# Patient Record
Sex: Male | Born: 1980 | Race: Black or African American | Hispanic: No | Marital: Single | Smoking: Current every day smoker
Health system: Southern US, Community
[De-identification: ages and names within clinical notes are randomized; demographics above are authoritative.]

---

## 2011-11-10 ENCOUNTER — Emergency Department (HOSPITAL_BASED_OUTPATIENT_CLINIC_OR_DEPARTMENT_OTHER)
Admission: EM | Admit: 2011-11-10 | Discharge: 2011-11-10 | Disposition: A | Discharge status: Home / Self Care | Payer: BC Managed Care – PPO | Attending: Emergency Medicine | Admitting: Emergency Medicine

## 2011-11-10 DIAGNOSIS — J069 Acute upper respiratory infection, unspecified: Secondary | ICD-10-CM | POA: Insufficient documentation

## 2011-11-10 DIAGNOSIS — R059 Cough, unspecified: Secondary | ICD-10-CM | POA: Insufficient documentation

## 2011-11-10 DIAGNOSIS — R05 Cough: Secondary | ICD-10-CM | POA: Insufficient documentation

## 2011-11-10 DIAGNOSIS — R509 Fever, unspecified: Secondary | ICD-10-CM | POA: Insufficient documentation

## 2011-11-10 NOTE — ED Notes (Signed)
Pt reports that Wednesday "I had chills and a cough".  Pt reports he left work early and his boss is requiring him to get verification that he does not have the flu before he will let him return to work.

## 2011-11-10 NOTE — ED Provider Notes (Signed)
History     CSN: [id number] Arrival date & time: No admission date for patient encounter.   First MD Initiated Contact with Patient 11/10/11 [phone]      Chief Complaint  Patient presents with  . Fever  . Cough    (Consider location/radiation/quality/duration/timing/severity/associated sxs/prior treatment) Patient is a 30 y.o. male presenting with fever. The history is provided by the patient.  Fever Primary symptoms of the febrile illness include fever and cough. Primary symptoms do not include wheezing, shortness of breath, nausea, vomiting or diarrhea. The current episode started 3 to 5 days ago. This is a new problem. The problem has been resolved.  The fever began 2 days ago. The fever has been resolved since its onset. The maximum temperature recorded prior to his arrival was unknown.  The cough began 2 days ago. The cough is non-productive.    History reviewed. No pertinent past medical history.  History reviewed. No pertinent past surgical history.  No family history on file.  History  Substance Use Topics  . Smoking status: Current Everyday Smoker  . Smokeless tobacco: Not on file  . Alcohol Use: Yes     Occasionally      Review of Systems  Constitutional: Positive for fever and chills.  HENT: Positive for congestion and rhinorrhea. Negative for sore throat.   Respiratory: Positive for cough. Negative for shortness of breath and wheezing.   Gastrointestinal: Negative for nausea, vomiting and diarrhea.  All other systems reviewed and are negative.    Allergies  Review of patient's allergies indicates no known allergies.  Home Medications  No current outpatient prescriptions on file.  BP 129/82  Pulse 85  Temp(Src) 98.6 F (37 C) (Oral)  Resp 20  SpO2 100%  Physical Exam  Nursing note and vitals reviewed. Constitutional: He is oriented to person, place, and time. He appears well-developed and well-nourished. No distress.  HENT:  Head: Normocephalic  and atraumatic.  Right Ear: Tympanic membrane and ear canal normal.  Left Ear: Tympanic membrane and ear canal normal.  Mouth/Throat: Oropharynx is clear and moist.  Eyes: Conjunctivae and EOM are normal. Pupils are equal, round, and reactive to light.  Neck: Normal range of motion. Neck supple.  Cardiovascular: Normal rate, regular rhythm and intact distal pulses.   No murmur heard. Pulmonary/Chest: Effort normal and breath sounds normal. No respiratory distress. He has no wheezes. He has no rales.  Neurological: He is alert and oriented to person, place, and time.  Skin: Skin is warm and dry. No rash noted. No erythema.  Psychiatric: He has a normal mood and affect. His behavior is normal.    ED Course  Procedures (including critical care time)  Labs Reviewed - No data to display No results found.   No diagnosis found.    MDM   Pt with symptoms consistent with viral URI.  Well appearing here.  No signs of breathing difficulty  No signs of pharyngitis, otitis or abnormal abdominal findings.    Patient states his symptoms were worse on Wednesday and he started feeling better yesterday. He went to work this morning and his boss told him he had to go be cleared to return to work to ensure he did not have the flu. On exam patient has normal vital signs and no complaints. He is afebrile and he has no abnormal physical exam findings. Patient is cleared to return to work no further workup needed at this time.     Gwyneth Sprout, MD 11/10/11 [phone]

## 2012-06-16 ENCOUNTER — Encounter (HOSPITAL_BASED_OUTPATIENT_CLINIC_OR_DEPARTMENT_OTHER): Payer: Self-pay | Admitting: *Deleted

## 2012-06-16 ENCOUNTER — Emergency Department (HOSPITAL_BASED_OUTPATIENT_CLINIC_OR_DEPARTMENT_OTHER)
Admission: EM | Admit: 2012-06-16 | Discharge: 2012-06-16 | Disposition: A | Discharge status: Home / Self Care | Payer: BC Managed Care – PPO | Attending: Emergency Medicine | Admitting: Emergency Medicine

## 2012-06-16 DIAGNOSIS — S239XXA Sprain of unspecified parts of thorax, initial encounter: Secondary | ICD-10-CM | POA: Insufficient documentation

## 2012-06-16 DIAGNOSIS — F17200 Nicotine dependence, unspecified, uncomplicated: Secondary | ICD-10-CM | POA: Insufficient documentation

## 2012-06-16 DIAGNOSIS — R079 Chest pain, unspecified: Secondary | ICD-10-CM | POA: Insufficient documentation

## 2012-06-16 DIAGNOSIS — T148XXA Other injury of unspecified body region, initial encounter: Secondary | ICD-10-CM

## 2012-06-16 DIAGNOSIS — S5010XA Contusion of unspecified forearm, initial encounter: Secondary | ICD-10-CM | POA: Insufficient documentation

## 2012-06-16 MED ORDER — HYDROCODONE-ACETAMINOPHEN 5-500 MG PO TABS
1.0000 | ORAL_TABLET | Freq: Four times a day (QID) | ORAL | Status: AC | PRN
Start: 2012-06-16 — End: 2012-06-26

## 2012-06-16 MED ORDER — CYCLOBENZAPRINE HCL 10 MG PO TABS: 10.0000 mg | ORAL_TABLET | Freq: Two times a day (BID) | ORAL | Status: AC | PRN
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
## 2012-06-16 NOTE — Discharge Instructions (Signed)
Contusion  A contusion is a deep bruise. Contusions happen when an injury causes bleeding under the skin. Signs of bruising include pain, puffiness (swelling), and discolored skin. The contusion may turn blue, purple, or yellow.  HOME CARE    Put ice on the injured area.   Put ice in a plastic bag.   Place a towel between your skin and the bag.   Leave the ice on for 15 to 20 minutes, 3 to 4 times a day.   Only take medicine as told by your doctor.   Rest the injured area.   If possible, raise (elevate) the injured area to lessen puffiness.  GET HELP RIGHT AWAY IF:    You have more bruising or puffiness.   You have pain that is getting worse.   Your puffiness or pain is not helped by medicine.  MAKE SURE YOU:    Understand these instructions.   Will watch your condition.   Will get help right away if you are not doing well or get worse.  Document Released: 05/01/2008 Document Revised: 11/02/2011 Document Reviewed: 09/18/2011  [employer] Patient Information 2012 [employer].
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
## 2012-12-30 ENCOUNTER — Encounter (HOSPITAL_BASED_OUTPATIENT_CLINIC_OR_DEPARTMENT_OTHER): Payer: Self-pay | Admitting: *Deleted

## 2012-12-30 ENCOUNTER — Emergency Department (HOSPITAL_BASED_OUTPATIENT_CLINIC_OR_DEPARTMENT_OTHER)
Admission: EM | Admit: 2012-12-30 | Discharge: 2012-12-30 | Disposition: A | Discharge status: Home / Self Care | Payer: BC Managed Care – PPO | Attending: Emergency Medicine | Admitting: Emergency Medicine

## 2012-12-30 DIAGNOSIS — K529 Noninfective gastroenteritis and colitis, unspecified: Secondary | ICD-10-CM

## 2012-12-30 DIAGNOSIS — F17200 Nicotine dependence, unspecified, uncomplicated: Secondary | ICD-10-CM | POA: Insufficient documentation

## 2012-12-30 DIAGNOSIS — K5289 Other specified noninfective gastroenteritis and colitis: Secondary | ICD-10-CM | POA: Insufficient documentation

## 2012-12-30 DIAGNOSIS — R197 Diarrhea, unspecified: Secondary | ICD-10-CM | POA: Insufficient documentation

## 2012-12-30 LAB — URINALYSIS, ROUTINE W REFLEX MICROSCOPIC
Bilirubin Urine: NEGATIVE
Hgb urine dipstick: NEGATIVE
Ketones, ur: NEGATIVE mg/dL
Nitrite: NEGATIVE
Protein, ur: NEGATIVE mg/dL
Urobilinogen, UA: 1 mg/dL (ref 0.0–1.0)

## 2012-12-30 LAB — URINE MICROSCOPIC-ADD ON

## 2012-12-30 NOTE — ED Notes (Signed)
Denies fever.  Son recently had cold sx., no other known exposure to anyone with illness.

## 2012-12-30 NOTE — ED Provider Notes (Signed)
History  This chart was scribed for Loren Racer, MD by Shari Heritage, ED Scribe. The patient was seen in room MH05/MH05. Patient's care was started at 1750.   CSN: [id number]  Arrival date & time 12/30/12  1625   First MD Initiated Contact with Patient 12/30/12 1750      Chief Complaint  Patient presents with  . Abdominal Pain    Patient is a 32 y.o. male presenting with abdominal pain. The history is provided by the patient. No language interpreter was used.  Abdominal Pain The primary symptoms of the illness include abdominal pain and diarrhea. The primary symptoms of the illness do not include fever, nausea or vomiting. The current episode started yesterday. The onset of the illness was sudden. The problem has been rapidly improving.  The abdominal pain began yesterday. The pain came on suddenly. The abdominal pain has been rapidly improving since its onset. The abdominal pain is located in the epigastric region. The abdominal pain does not radiate. The abdominal pain is relieved by bowel movement.  The diarrhea began yesterday. The diarrhea is watery. The diarrhea occurs 5 to 10 times per day.  Symptoms associated with the illness do not include chills.    HPI Comments: Dennis Greene is a 32 y.o. male who presents to the Emergency Department complaining of gradually improving, epigastric abdominal cramping and improving diarrhea onset 33 hours ago. Abdominal pain is mild in severity. The timing is constant and it does not radiate. Abdominal pain is relieved by bowel movements. Patient states that he had 5 episodes of watery diarrhea since symptom onset. His last BM was this morning. Patient denies blood in stool, fever, chills, nausea or vomiting. He says the last meal he had prior to diarrhea onset was fried chicken and [employer]. He says that others ate the same meal, but had no symptoms. Patient denies any other significant past medical or surgical history.    No family history on  file.  History  Substance Use Topics  . Smoking status: Current Every Day Smoker -- 0.2 packs/day for 10 years    Types: Cigarettes  . Smokeless tobacco: Not on file  . Alcohol Use: Yes     Comment: Occasionally     Review of Systems  Constitutional: Negative for fever and chills.  Gastrointestinal: Positive for abdominal pain and diarrhea. Negative for nausea, vomiting and blood in stool.  All other systems reviewed and are negative.    Allergies  Review of patient's allergies indicates no known allergies.  Home Medications  No current outpatient prescriptions on file.  BP 148/94  Pulse 92  Temp 98 F (36.7 C) (Oral)  Resp 20  SpO2 100%  Physical Exam  Constitutional: He is oriented to person, place, and time. He appears well-developed and well-nourished.       Appears well hydrated.  HENT:  Head: Normocephalic and atraumatic.  Mouth/Throat: Oropharynx is clear and moist.  Eyes: Conjunctivae normal and EOM are normal. Pupils are equal, round, and reactive to light.  Cardiovascular: Normal rate, regular rhythm and normal heart sounds.   Pulmonary/Chest: Effort normal and breath sounds normal.  Abdominal: Soft. Bowel sounds are normal. He exhibits no distension. There is no tenderness. There is no rebound and no guarding.  Musculoskeletal: Normal range of motion. He exhibits no edema and no tenderness.       Moves all extremities without difficulty.  Neurological: He is alert and oriented to person, place, and time.  Skin: Skin is warm and  dry. No rash noted.  Psychiatric: He has a normal mood and affect. His behavior is normal.    ED Course  Procedures (including critical care time) DIAGNOSTIC STUDIES: Oxygen Saturation is 100% on room air, normal by my interpretation.    COORDINATION OF CARE: 6:10 PM- Patient informed of current plan for treatment and evaluation and agrees with plan at this time.    Labs Reviewed  URINALYSIS, ROUTINE W REFLEX MICROSCOPIC -  Abnormal; Notable for the following:    APPearance TURBID (*)     All other components within normal limits  URINE MICROSCOPIC-ADD ON   No results found.   1. Gastroenteritis       MDM  I personally performed the services described in this documentation, which was scribed in my presence. The recorded information has been reviewed and is accurate.    Loren Racer, MD 12/30/12 [phone]

## 2012-12-30 NOTE — ED Notes (Signed)
Abdominal pain diarrhea and vomiting. No diarrhea and vomiting today. He feels better today but still has a little abdominal discomfort.

## 2015-01-18 ENCOUNTER — Encounter (HOSPITAL_BASED_OUTPATIENT_CLINIC_OR_DEPARTMENT_OTHER): Payer: Self-pay | Admitting: Emergency Medicine

## 2015-01-18 ENCOUNTER — Emergency Department (HOSPITAL_BASED_OUTPATIENT_CLINIC_OR_DEPARTMENT_OTHER)
Admission: EM | Admit: 2015-01-18 | Discharge: 2015-01-18 | Disposition: A | Discharge status: Home / Self Care | Payer: BLUE CROSS/BLUE SHIELD | Attending: Emergency Medicine | Admitting: Emergency Medicine

## 2015-01-18 DIAGNOSIS — Z720 Tobacco use: Secondary | ICD-10-CM | POA: Insufficient documentation

## 2015-01-18 DIAGNOSIS — J029 Acute pharyngitis, unspecified: Secondary | ICD-10-CM | POA: Diagnosis present

## 2015-01-18 DIAGNOSIS — J020 Streptococcal pharyngitis: Secondary | ICD-10-CM | POA: Insufficient documentation

## 2015-01-18 LAB — RAPID STREP SCREEN (MED CTR MEBANE ONLY): STREPTOCOCCUS, GROUP A SCREEN (DIRECT): POSITIVE — AB

## 2015-01-18 MED ORDER — ACETAMINOPHEN 325 MG PO TABS
650.0000 mg | ORAL_TABLET | Freq: Once | ORAL | Status: AC
  Administered 2015-01-18: 650 mg via ORAL
  Filled 2015-01-18: qty 2

## 2015-01-18 MED ORDER — PENICILLIN G BENZATHINE 1200000 UNIT/2ML IM SUSP
1.2000 10*6.[IU] | Freq: Once | INTRAMUSCULAR | Status: AC
  Administered 2015-01-18: 1.2 10*6.[IU] via INTRAMUSCULAR
  Filled 2015-01-18: qty 2

## 2015-01-18 MED ORDER — DEXAMETHASONE SODIUM PHOSPHATE 10 MG/ML IJ SOLN
10.0000 mg | Freq: Once | INTRAMUSCULAR | Status: AC
  Administered 2015-01-18: 10 mg via INTRAMUSCULAR
  Filled 2015-01-18: qty 1

## 2015-01-18 NOTE — ED Notes (Signed)
Pt d/c home- medications discussed- work note given

## 2015-01-18 NOTE — Discharge Instructions (Signed)

## 2015-01-18 NOTE — ED Provider Notes (Signed)
CSN: [id number]     Arrival date & time 01/18/15  [id number] History   First MD Initiated Contact with Patient 01/18/15 0901     Chief Complaint  Patient presents with  . Sore Throat     (Consider location/radiation/quality/duration/timing/severity/associated sxs/prior Treatment) Patient is a [age] y.o. male presenting with pharyngitis. The history is provided by the patient. No language interpreter was used.  Sore Throat This is a new problem. The current episode started in the past 7 days. The problem occurs constantly. The problem has been unchanged. Associated symptoms include a fever and a sore throat. Pertinent negatives include no coughing, numbness, rash or vomiting. The symptoms are aggravated by swallowing. He has tried nothing for the symptoms.    No past medical history on file. No past surgical history on file. No family history on file. History  Substance Use Topics  . Smoking status: Current Every Day Smoker -- 0.25 packs/day for 10 years    Types: Cigarettes  . Smokeless tobacco: Not on file  . Alcohol Use: Yes     Comment: Occasionally    Review of Systems  Constitutional: Positive for fever.  HENT: Positive for sore throat.   Respiratory: Negative for cough.   Cardiovascular: Negative.   Gastrointestinal: Negative for vomiting.  Skin: Negative for rash.  Neurological: Negative for numbness.  All other systems reviewed and are negative.     Allergies  Review of patient's allergies indicates no known allergies.  Home Medications   Prior to Admission medications   Not on File   BP 138/86 mmHg  Pulse 117  Temp(Src) 102.3 F (39.1 C) (Oral)  Resp 18  Ht 5\' 11"  (1.803 m)  Wt 189 lb (85.73 kg)  BMI 26.37 kg/m2  SpO2 100% Physical Exam  Constitutional: He is oriented to person, place, and time. He appears well-developed and well-nourished.  HENT:  Right Ear: External ear normal.  Left Ear: External ear normal.  Mouth/Throat: Posterior oropharyngeal edema  and posterior oropharyngeal erythema present.  Eyes: EOM are normal. Pupils are equal, round, and reactive to light.  Cardiovascular: Normal rate and regular rhythm.   Pulmonary/Chest: Effort normal and breath sounds normal.  Musculoskeletal: Normal range of motion.  Neurological: He is alert and oriented to person, place, and time.  Skin: Skin is warm and dry.  Psychiatric: He has a normal mood and affect.  Nursing note and vitals reviewed.   ED Course  Procedures (including critical care time) Labs Review Labs Reviewed  RAPID STREP SCREEN - Abnormal; Notable for the following:    Streptococcus, Group A Screen (Direct) POSITIVE (*)    All other components within normal limits    Imaging Review No results found.   EKG Interpretation None      MDM   Final diagnoses:  Strep pharyngitis    Pt treated for strep with bicillin and decadron. Discussed return precautions. No sign of pta at this time.no meningeal signs   Teressa LowerVrinda Evia Goldsmith, NP 01/18/15 [id number]  Tilden FossaElizabeth Rees, MD 01/18/15 [phone]

## 2015-01-18 NOTE — ED Notes (Signed)
Sore throat with fever, chills, sweats since Saturday.

## 2018-08-06 ENCOUNTER — Ambulatory Visit (INDEPENDENT_AMBULATORY_CARE_PROVIDER_SITE_OTHER): Payer: Worker's Compensation

## 2018-08-06 ENCOUNTER — Encounter (HOSPITAL_COMMUNITY): Payer: Self-pay | Admitting: Emergency Medicine

## 2018-08-06 ENCOUNTER — Ambulatory Visit (HOSPITAL_COMMUNITY)
Admission: EM | Admit: 2018-08-06 | Discharge: 2018-08-06 | Disposition: A | Discharge status: Home / Self Care | Payer: Worker's Compensation | Attending: Family Medicine | Admitting: Family Medicine

## 2018-08-06 ENCOUNTER — Other Ambulatory Visit: Payer: Self-pay

## 2018-08-06 DIAGNOSIS — S92902A Unspecified fracture of left foot, initial encounter for closed fracture: Secondary | ICD-10-CM | POA: Diagnosis not present

## 2018-08-06 MED ORDER — MELOXICAM 7.5 MG PO TABS: 7.5000 mg | ORAL_TABLET | Freq: Every day | ORAL | 0 refills | Status: DC

## 2018-08-06 MED ORDER — HYDROCODONE-ACETAMINOPHEN 5-325 MG PO TABS: 1.0000 | ORAL_TABLET | Freq: Four times a day (QID) | ORAL | 0 refills | Status: AC | PRN

## 2018-08-06 NOTE — ED Triage Notes (Signed)
Pain in left foot.  Foot was wedged under equipment and patient pulled abruptly to get foot out.  Able to move toes.  Pedal pulse 2 +.  Scrape to back of foot

## 2018-08-06 NOTE — ED Provider Notes (Signed)
[facility]    CSN: [id number] Arrival date & time: 08/06/18  1634     History   Chief Complaint Chief Complaint  Patient presents with  . Foot Pain    HPI Dennis Greene is a 37 y.o. male.   37 year old male comes in for evaluation after injury of the left foot at work.  States foot was wedged under equipment, and pain to left foot after pulling the foot out abruptly.  Has significant swelling to the dorsal aspect of the foot.  Denies numbness, tingling.  He also has an abrasion to the back of the heel.  States pain was tolerable when injury happened, but has been slowly increasing.  He has had trouble bearing weight since the injury.  Took Tylenol without relief.      History reviewed. No pertinent past medical history.  There are no active problems to display for this patient.   History reviewed. No pertinent surgical history.     Home Medications    Prior to Admission medications   Medication Sig Start Date End Date Taking? Authorizing Provider  HYDROcodone-acetaminophen (NORCO/VICODIN) 5-325 MG tablet Take 1 tablet by mouth every 6 (six) hours as needed for severe pain. 08/06/18   Cathie Hoops, Dulcemaria Bula V, PA-C  meloxicam (MOBIC) 7.5 MG tablet Take 1 tablet (7.5 mg total) by mouth daily. 08/06/18   Belinda Fisher, PA-C    Family History Family History  Problem Relation Age of Onset  . Diabetes Mother   . Hypertension Mother     Social History Social History   Tobacco Use  . Smoking status: Current Every Day Smoker    Packs/day: 0.25    Years: 10.00    Pack years: 2.50    Types: Cigarettes  Substance Use Topics  . Alcohol use: Yes    Comment: Occasionally  . Drug use: No     Allergies   Patient has no known allergies.   Review of Systems Review of Systems  Reason unable to perform ROS: See HPI as above.     Physical Exam Triage Vital Signs ED Triage Vitals  Enc Vitals Group     BP 08/06/18 1701 (!) 148/91     Pulse Rate 08/06/18 1701 95     Resp  08/06/18 1701 18     Temp 08/06/18 1701 98.5 F (36.9 C)     Temp Source 08/06/18 1701 Oral     SpO2 08/06/18 1701 100 %     Weight --      Height --      Head Circumference --      Peak Flow --      Pain Score 08/06/18 1658 8     Pain Loc --      Pain Edu? --      Excl. in GC? --    No data found.  Updated Vital Signs BP (!) 148/91 (BP Location: Left Arm)   Pulse 95   Temp 98.5 F (36.9 C) (Oral)   Resp 18   SpO2 100%    Physical Exam  Constitutional: He is oriented to person, place, and time. He appears well-developed and well-nourished. No distress.  HENT:  Head: Normocephalic and atraumatic.  Eyes: Pupils are equal, round, and reactive to light. Conjunctivae are normal.  Musculoskeletal:  Swelling to the dorsal aspect of the foot without erythema, warmth, contusion.  Abrasion to the posterior heel.  Bleeding controlled.  Tenderness to palpation diffusely of the MTPs.  No tenderness to  palpation of the ankle.  Decreased range of motion due to pain.  Strength deferred.  Sensation intact and equal bilaterally.  Pedal pulse 2+ and equal bilaterally.  Cap refill less than 2 seconds.  Neurological: He is alert and oriented to person, place, and time.  Skin: He is not diaphoretic.     UC Treatments / Results  Labs (all labs ordered are listed, but only abnormal results are displayed) Labs Reviewed - No data to display  EKG None  Radiology Dg Foot Complete Left  Result Date: 08/06/2018 CLINICAL DATA:  Left foot caught under heavy pallet at work. Left foot pain. Initial encounter. EXAM: LEFT FOOT - COMPLETE 3+ VIEW COMPARISON:  None. FINDINGS: A fracture is seen along the dorsal aspect of the midfoot on the lateral projection, in the region of the navicular-cuneiform articulation. No other fractures are identified. No evidence of dislocation. IMPRESSION: Fracture along the dorsal aspect of midfoot best seen on the lateral projection, in the region of the navicular-cuneiform  articulation. Electronically Signed   By: Myles Rosenthal M.D.   On: 08/06/2018 17:40    Procedures Procedures (including critical care time)  Medications Ordered in UC Medications - No data to display  Initial Impression / Assessment and Plan / UC Course  I have reviewed the triage vital signs and the nursing notes.  Pertinent labs & imaging results that were available during my care of the patient were reviewed by me and considered in my medical decision making (see chart for details).    Discussed x-ray results with patient.  Cam walker and crutches instructed.  Mobic for pain, Tylenol for breakthrough pain.  Norco as needed for severe pain.  Specifically discussed with patient that Norco contains Tylenol, appropriate dosage discussed.  Ice compress, elevation.  Patient to follow-up with occupational health for further evaluation and referral needed.  Final Clinical Impressions(s) / UC Diagnoses   Final diagnoses:  Closed fracture of left foot, initial encounter    ED Prescriptions    Medication Sig Dispense Auth. Provider   meloxicam (MOBIC) 7.5 MG tablet Take 1 tablet (7.5 mg total) by mouth daily. 15 tablet Doniel Maiello V, PA-C   HYDROcodone-acetaminophen (NORCO/VICODIN) 5-325 MG tablet Take 1 tablet by mouth every 6 (six) hours as needed for severe pain. 10 tablet Threasa Alpha, [place] 08/06/18 1830

## 2018-08-06 NOTE — Discharge Instructions (Signed)
As discussed, fracture to the left foot.  Start Mobic as directed.  You can take Tylenol for breakthrough pain.  If needed, you can take Norco for severe pain.  As discussed, Norco contains 325 mg of Tylenol.  Please do not exceed 1000 mg of Tylenol every 8 hours, and do not exceed a total of 3000 mg of Tylenol a day.  Ice compress, elevation.  Cam walker and crutches.  Follow-up with orthopedics for further evaluation.  As discussed, if this is Worker's Comp., may need to follow-up with occupational health first for referral to orthopedics.

## 2018-10-07 DIAGNOSIS — S92253A Displaced fracture of navicular [scaphoid] of unspecified foot, initial encounter for closed fracture: Secondary | ICD-10-CM | POA: Insufficient documentation

## 2019-12-26 IMAGING — DX DG FOOT COMPLETE 3+V*L*
3 series · 3 of 3 positions shown · non-contrast
Comparison: None.

CLINICAL DATA: Left foot caught under heavy pallet at work. Left
foot pain. Initial encounter.

EXAM:
LEFT FOOT - COMPLETE 3+ VIEW

[foot ap]
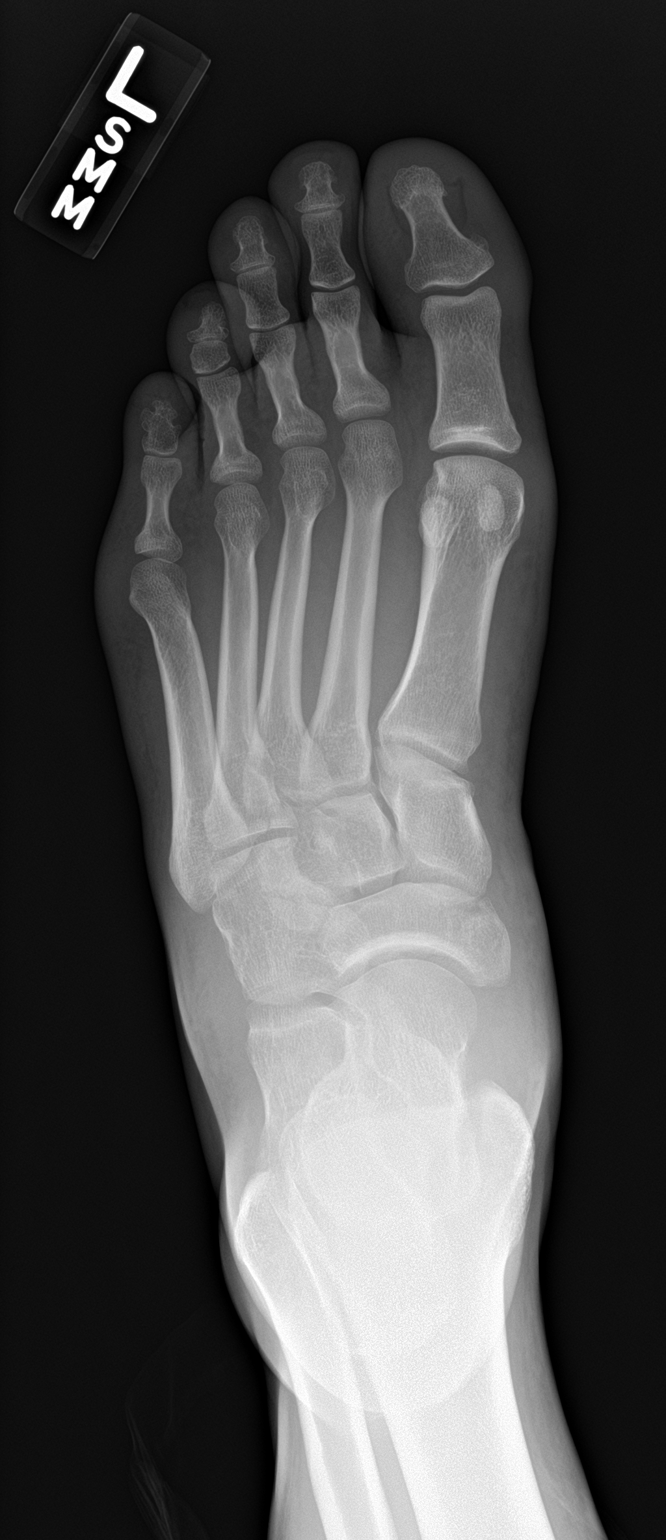

[foot obl]
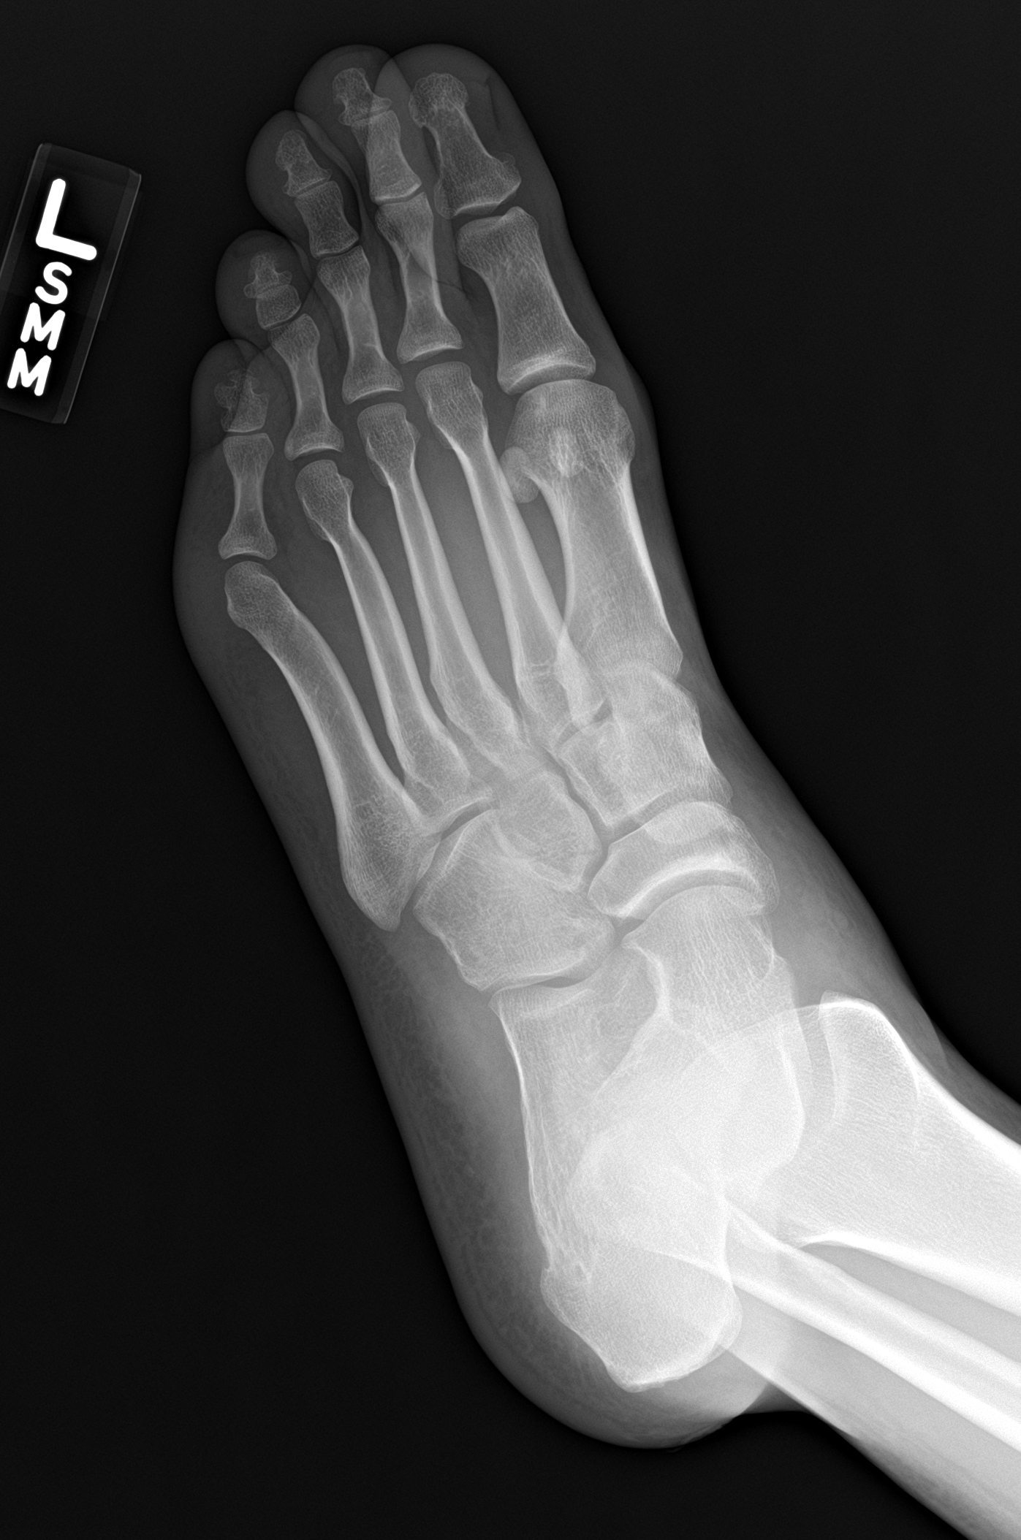

[foot lat]
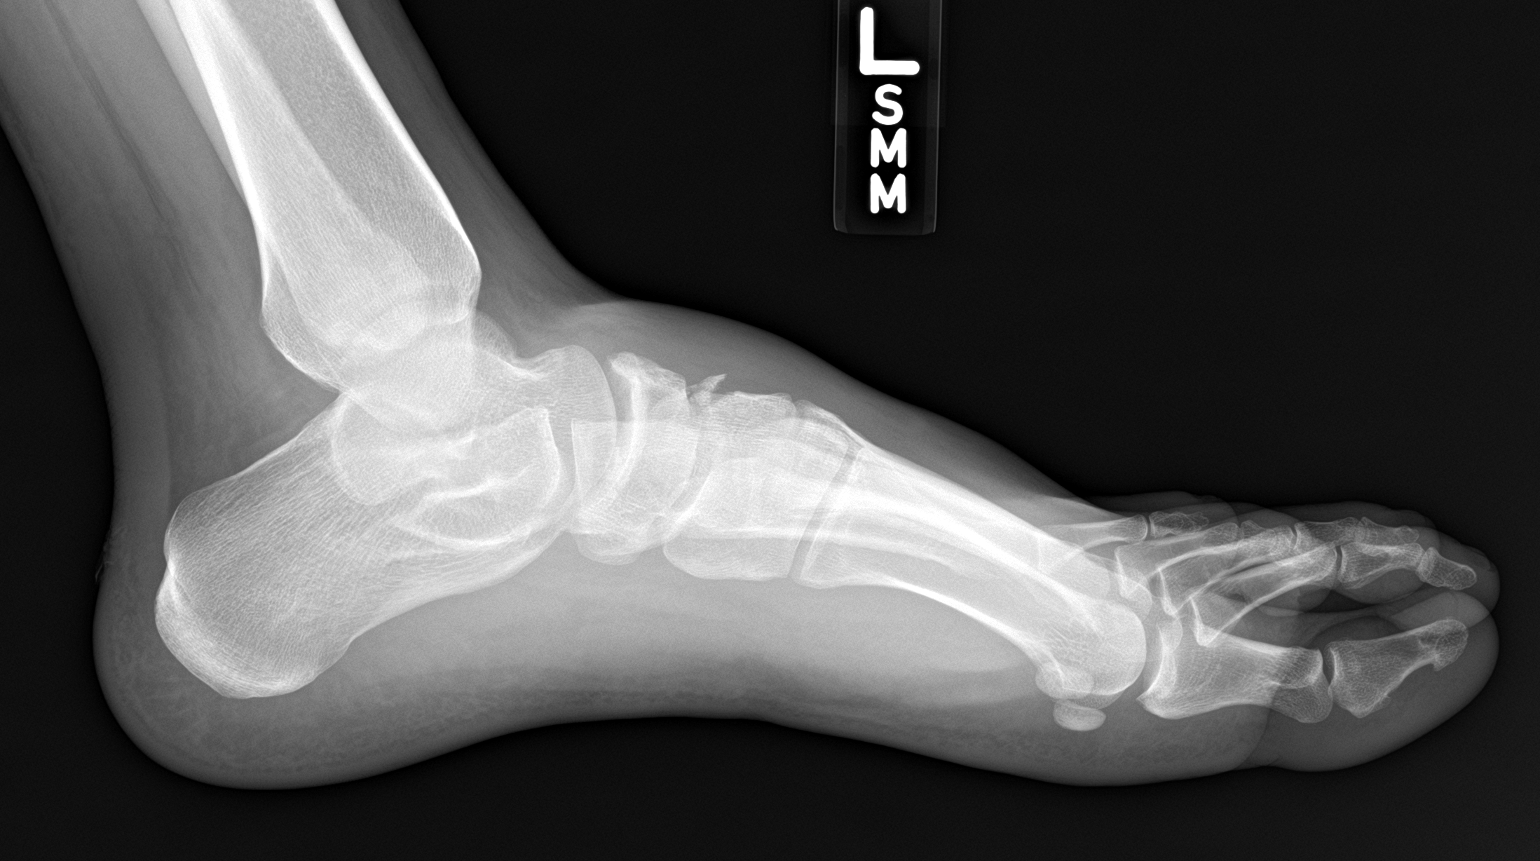

[3 of 3 positions shown; findings below may reference images not displayed]

FINDINGS: A fracture is seen along the dorsal aspect of the midfoot on the
lateral projection, in the region of the navicular-cuneiform
articulation. No other fractures are identified. No evidence of
dislocation.
IMPRESSION: Fracture along the dorsal aspect of midfoot best seen on the lateral
projection, in the region of the navicular-cuneiform articulation.

## 2021-03-10 ENCOUNTER — Ambulatory Visit (INDEPENDENT_AMBULATORY_CARE_PROVIDER_SITE_OTHER): Payer: Managed Care, Other (non HMO)

## 2021-03-10 ENCOUNTER — Ambulatory Visit (INDEPENDENT_AMBULATORY_CARE_PROVIDER_SITE_OTHER): Payer: Managed Care, Other (non HMO) | Admitting: Podiatry

## 2021-03-10 ENCOUNTER — Encounter: Payer: Self-pay | Admitting: Podiatry

## 2021-03-10 ENCOUNTER — Other Ambulatory Visit: Payer: Self-pay

## 2021-03-10 ENCOUNTER — Other Ambulatory Visit: Payer: Self-pay | Admitting: Podiatry

## 2021-03-10 DIAGNOSIS — M722 Plantar fascial fibromatosis: Secondary | ICD-10-CM

## 2021-03-10 DIAGNOSIS — M19079 Primary osteoarthritis, unspecified ankle and foot: Secondary | ICD-10-CM

## 2021-03-10 DIAGNOSIS — M19072 Primary osteoarthritis, left ankle and foot: Secondary | ICD-10-CM

## 2021-03-10 DIAGNOSIS — M79671 Pain in right foot: Secondary | ICD-10-CM

## 2021-03-15 ENCOUNTER — Encounter: Payer: Self-pay | Admitting: Podiatry

## 2021-03-15 NOTE — Progress Notes (Signed)
  Subjective:  Patient ID: Dennis Greene, male    DOB: Aug 23, 1981,  MRN: [id number]  No chief complaint on file.   40 y.o. male presents with the above complaint.  Patient presents with complaint left dorsal midfoot pain.  Patient states been going on for quite some time is generally tends to be bilateral however the left side is much worse on right side today.  Patient states is painful to walk pain with certain type of shoes.  She has not seen anyone else prior to seeing me.  She denies any other acute complaints.  She would like to discuss treatment options for this.  Pain scale is 6 out of 10 is dull achy in nature.  No burning numbness tingling noted   Review of Systems: Negative except as noted in the HPI. Denies N/V/F/Ch.  No past medical history on file.  Current Outpatient Medications:  .  HYDROcodone-acetaminophen (NORCO/VICODIN) 5-325 MG tablet, Take 1 tablet by mouth every 6 (six) hours as needed for severe pain., Disp: 10 tablet, Rfl: 0 .  meloxicam (MOBIC) 7.5 MG tablet, Take 1 tablet (7.5 mg total) by mouth daily., Disp: 15 tablet, Rfl: 0  Social History   Tobacco Use  Smoking Status Current Every Day Smoker  . Packs/day: 0.25  . Years: 10.00  . Pack years: 2.50  . Types: Cigarettes  Smokeless Tobacco Not on file    No Known Allergies Objective:  There were no vitals filed for this visit. There is no height or weight on file to calculate BMI. Constitutional Well developed. Well nourished.  Vascular Dorsalis pedis pulses palpable bilaterally. Posterior tibial pulses palpable bilaterally. Capillary refill normal to all digits.  No cyanosis or clubbing noted. Pedal hair growth normal.  Neurologic Normal speech. Oriented to person, place, and time. Epicritic sensation to light touch grossly present bilaterally.  Dermatologic Nails well groomed and normal in appearance. No open wounds. No skin lesions.  Orthopedic:  Pain on palpation left dorsal midfoot.  No pain  with range of motion of the first through fifth metatarsophalangeal joint.  No pain with resisted dorsiflexion of the digits or plantarflexion of the digit.  Mild exostosis present at the dorsal midfoot.  No other bony abnormalities identified.  Lisfranc interval well-maintained   Radiographs: 3 views of skeletally mature adult left foot: Midfoot arthritis noted.  No bony abnormalities identified.  No bunion deformity noted.  No fractures noted.   Assessment:   1. Arthritis of midfoot    Plan:  Patient was evaluated and treated and all questions answered.  Left midfoot arthritis -I explained the patient the etiology of arthritis and various treatment options were extensively discussed.  Given the amount of pain she is having I believe patient would benefit from a steroid injection.  This will help decrease acute inflammatory component associated pain.  Patient agrees with plan like to proceed with steroid injection. -A steroid injection was performed at left midfoot arthritis using 1% plain Lidocaine and 10 mg of Kenalog. This was well tolerated. -If there is no improvement we can discuss doing further cam boot immobilization versus advanced imaging   No follow-ups on file.

## 2021-03-16 ENCOUNTER — Encounter: Payer: Self-pay | Admitting: Podiatry

## 2021-03-29 ENCOUNTER — Encounter: Payer: Self-pay | Admitting: Podiatry

## 2021-04-08 ENCOUNTER — Encounter: Payer: Self-pay | Admitting: Podiatry

## 2021-04-08 ENCOUNTER — Other Ambulatory Visit: Payer: Self-pay

## 2021-04-08 ENCOUNTER — Ambulatory Visit (INDEPENDENT_AMBULATORY_CARE_PROVIDER_SITE_OTHER): Payer: Managed Care, Other (non HMO) | Admitting: Podiatry

## 2021-04-08 DIAGNOSIS — M76822 Posterior tibial tendinitis, left leg: Secondary | ICD-10-CM

## 2021-04-08 DIAGNOSIS — M21961 Unspecified acquired deformity of right lower leg: Secondary | ICD-10-CM

## 2021-04-08 DIAGNOSIS — M21962 Unspecified acquired deformity of left lower leg: Secondary | ICD-10-CM | POA: Diagnosis not present

## 2021-04-08 DIAGNOSIS — Q666 Other congenital valgus deformities of feet: Secondary | ICD-10-CM | POA: Diagnosis not present

## 2021-04-11 ENCOUNTER — Encounter: Payer: Self-pay | Admitting: Podiatry

## 2021-04-12 ENCOUNTER — Encounter: Payer: Self-pay | Admitting: Podiatry

## 2021-04-12 NOTE — Progress Notes (Signed)
Subjective:  Patient ID: Dennis Greene, male    DOB: Oct 05, 1981,  MRN: [id number]  Chief Complaint  Patient presents with  . Follow-up    Reports injection helped only briefly. Still feeling pain/discomfort during most shifts. Reports started feeling tension on left medial side of foot 1-2 weeks ago.     40 y.o. male presents with the above complaint.  Patient presents with new complaint of right medial foot pain that has been going for about 1 to 2 weeks has progressive gotten worse.  The injection helped on the dorsal aspect of the arthritis which he does feel much better.  He would like to discuss treatment options for this.  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  He would like to discuss treatment options   Review of Systems: Negative except as noted in the HPI. Denies N/V/F/Ch.  No past medical history on file.  Current Outpatient Medications:  .  HYDROcodone-acetaminophen (NORCO/VICODIN) 5-325 MG tablet, Take 1 tablet by mouth every 6 (six) hours as needed for severe pain., Disp: 10 tablet, Rfl: 0 .  meloxicam (MOBIC) 7.5 MG tablet, Take 1 tablet (7.5 mg total) by mouth daily., Disp: 15 tablet, Rfl: 0  Social History   Tobacco Use  Smoking Status Current Every Day Smoker  . Packs/day: 0.25  . Years: 10.00  . Pack years: 2.50  . Types: Cigarettes  Smokeless Tobacco Not on file    No Known Allergies Objective:  There were no vitals filed for this visit. There is no height or weight on file to calculate BMI. Constitutional Well developed. Well nourished.  Vascular Dorsalis pedis pulses palpable bilaterally. Posterior tibial pulses palpable bilaterally. Capillary refill normal to all digits.  No cyanosis or clubbing noted. Pedal hair growth normal.  Neurologic Normal speech. Oriented to person, place, and time. Epicritic sensation to light touch grossly present bilaterally.  Dermatologic Nails well groomed and normal in appearance. No open wounds. No skin  lesions.  Orthopedic:  No pain on palpation left dorsal midfoot.  No pain with range of motion of the first through fifth metatarsophalangeal joint.  No pain with resisted dorsiflexion of the digits or plantarflexion of the digit.  Mild exostosis present at the dorsal midfoot.  No other bony abnormalities identified.  Lisfranc interval well-maintained  Pain on palpation along the course of the posterior tibial tendon including the insertion.  Pain with resisted inversion plantarflexion of the foot.  No pain with dorsiflexion eversion of the foot.  No pain at the peroneal tendon, Achilles tendon, ATFL ligament   Radiographs: 3 views of skeletally mature adult left foot: Midfoot arthritis noted.  No bony abnormalities identified.  No bunion deformity noted.  No fractures noted.   Assessment:   1. Posterior tibial tendinitis, left   2. Foot deformity, bilateral   3. Pes planovalgus    Plan:  Patient was evaluated and treated and all questions answered.  Left midfoot arthritis -Clinically resolved  Posterior tibial tendinitis left -I explained the patient the etiology of posterior greater tibial tendinitis and various treatment options were discussed.  The driving force is likely pes planovalgus foot structure.  Given the amount of pain he is having I believe patient would benefit from cam boot immobilization to allow the soft tissue and the tendon to heal.  Patient agrees with the plan. -Cam boot was dispensed.  Pes planovalgus -I explained the patient the etiology of pes planovalgus and various treatment options were discussed.  Given that patient  is having arch and posterior tibial tendinitis I believe this is likely the symptoms of flatfoot.  At this time patient will benefit from custom-made orthotics. -Orthotics were casted for   No follow-ups on file.

## 2021-04-18 ENCOUNTER — Telehealth: Payer: Self-pay | Admitting: Podiatry

## 2021-04-18 ENCOUNTER — Encounter: Payer: Self-pay | Admitting: Podiatry

## 2021-04-18 NOTE — Telephone Encounter (Signed)
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 He will be cancelling any future appointments and will no longer need the orthotics. Please advise.

## 2021-04-18 NOTE — Telephone Encounter (Signed)
Per Message I received I have called [facility] and talked with Trinna Post and we have canceled the orthotic order.

## 2021-04-27 ENCOUNTER — Encounter: Payer: Self-pay | Admitting: Podiatry

## 2021-05-02 ENCOUNTER — Encounter: Payer: Self-pay | Admitting: Podiatry

## 2021-05-06 ENCOUNTER — Ambulatory Visit: Payer: Managed Care, Other (non HMO) | Admitting: Podiatry

## 2021-05-13 ENCOUNTER — Ambulatory Visit: Payer: Managed Care, Other (non HMO) | Admitting: Podiatry

## 2021-05-19 ENCOUNTER — Encounter: Payer: Self-pay | Admitting: Podiatry

## 2021-06-03 ENCOUNTER — Other Ambulatory Visit: Payer: Self-pay

## 2021-06-03 ENCOUNTER — Ambulatory Visit (INDEPENDENT_AMBULATORY_CARE_PROVIDER_SITE_OTHER): Payer: Managed Care, Other (non HMO) | Admitting: Podiatry

## 2021-06-03 ENCOUNTER — Encounter: Payer: Self-pay | Admitting: Podiatry

## 2021-06-03 ENCOUNTER — Ambulatory Visit (INDEPENDENT_AMBULATORY_CARE_PROVIDER_SITE_OTHER): Payer: Managed Care, Other (non HMO)

## 2021-06-03 DIAGNOSIS — M76822 Posterior tibial tendinitis, left leg: Secondary | ICD-10-CM

## 2021-06-07 ENCOUNTER — Encounter: Payer: Self-pay | Admitting: Podiatry

## 2021-06-07 NOTE — Progress Notes (Signed)
Subjective:  Patient ID: Dennis Greene, male    DOB: 1981/05/05,  MRN: [id number]  Chief Complaint  Patient presents with   Foot Pain    Left foot pain  PT stated that he has had a throbbing sensation for about 5 motnhs     40 y.o. male presents with the above complaint.  Patient presents with follow-up of left medial posterior tibial tendinitis.  Patient states is a little bit better.  He has been ambulating in the boot which seems to help.  However he still has not gone as completely pain-free as he would like.  He would like to discuss neck treatment options he denies any other acute complaints.   Review of Systems: Negative except as noted in the HPI. Denies N/V/F/Ch.  No past medical history on file.  Current Outpatient Medications:    HYDROcodone-acetaminophen (NORCO/VICODIN) 5-325 MG tablet, Take 1 tablet by mouth every 6 (six) hours as needed for severe pain., Disp: 10 tablet, Rfl: 0   meloxicam (MOBIC) 7.5 MG tablet, Take 1 tablet (7.5 mg total) by mouth daily., Disp: 15 tablet, Rfl: 0  Social History   Tobacco Use  Smoking Status Every Day   Packs/day: 0.25   Years: 10.00   Pack years: 2.50   Types: Cigarettes  Smokeless Tobacco Not on file    No Known Allergies Objective:  There were no vitals filed for this visit. There is no height or weight on file to calculate BMI. Constitutional Well developed. Well nourished.  Vascular Dorsalis pedis pulses palpable bilaterally. Posterior tibial pulses palpable bilaterally. Capillary refill normal to all digits.  No cyanosis or clubbing noted. Pedal hair growth normal.  Neurologic Normal speech. Oriented to person, place, and time. Epicritic sensation to light touch grossly present bilaterally.  Dermatologic Nails well groomed and normal in appearance. No open wounds. No skin lesions.  Orthopedic:  No pain on palpation left dorsal midfoot.  No pain with range of motion of the first through fifth metatarsophalangeal  joint.  No pain with resisted dorsiflexion of the digits or plantarflexion of the digit.  Mild exostosis present at the dorsal midfoot.  No other bony abnormalities identified.  Lisfranc interval well-maintained  Pain on palpation along the course of the posterior tibial tendon including the insertion.  Pain with resisted inversion plantarflexion of the foot.  No pain with dorsiflexion eversion of the foot.  No pain at the peroneal tendon, Achilles tendon, ATFL ligament   Radiographs: 3 views of skeletally mature adult left foot: Midfoot arthritis noted.  No bony abnormalities identified.  No bunion deformity noted.  No fractures noted.   Assessment:   1. Posterior tibial tendinitis, left    Plan:  Patient was evaluated and treated and all questions answered.  Left midfoot arthritis -Clinically resolved  Posterior tibial tendinitis left -I explained the patient the etiology of posterior greater tibial tendinitis and various treatment options were discussed.  The driving force is likely pes planovalgus foot structure.  Given that he still has a lot of residual pain without any resolve meant I believe patient will benefit from an MRI evaluation however he states that he will work with his Worker's Comp. tomorrow and get an MRI. -Tri-Lock ankle brace was dispensed for transition.  Pes planovalgus -I explained the patient the etiology of pes planovalgus and various treatment options were discussed.  Given that patient is having arch and posterior tibial tendinitis I believe this is likely the symptoms of flatfoot.  At this time patient  will benefit from custom-made orthotics. -Orthotics were casted for   No follow-ups on file.

## 2021-06-14 ENCOUNTER — Encounter: Payer: Self-pay | Admitting: Podiatry

## 2021-06-29 ENCOUNTER — Other Ambulatory Visit: Payer: Self-pay

## 2021-06-29 ENCOUNTER — Ambulatory Visit (INDEPENDENT_AMBULATORY_CARE_PROVIDER_SITE_OTHER): Payer: Managed Care, Other (non HMO) | Admitting: Podiatry

## 2021-06-29 ENCOUNTER — Encounter: Payer: Self-pay | Admitting: Podiatry

## 2021-06-29 DIAGNOSIS — M21961 Unspecified acquired deformity of right lower leg: Secondary | ICD-10-CM

## 2021-06-29 DIAGNOSIS — M21962 Unspecified acquired deformity of left lower leg: Secondary | ICD-10-CM | POA: Diagnosis not present

## 2021-06-29 DIAGNOSIS — M76822 Posterior tibial tendinitis, left leg: Secondary | ICD-10-CM

## 2021-07-01 ENCOUNTER — Encounter: Payer: Self-pay | Admitting: Podiatry

## 2021-07-01 NOTE — Progress Notes (Signed)
Subjective:  Patient ID: Dennis Greene, male    DOB: [birth year],  MRN: [id number]  Chief Complaint  Patient presents with   Foot Pain    Pt stated that he is doing okay he is concerned about the place on the top of his foot     40 y.o. male presents with the above complaint.  Patient presents with follow-up of left medial posterior tibial tendinitis.  Patient states is a little bit better.  He has been ambulating with the boot and with the brace.  The brace does help a little bit.  He just wants to make sure that there is nothing new and has been dealing with the pain.  He is working on getting MRI approved through the [employer].   Review of Systems: Negative except as noted in the HPI. Denies N/V/F/Ch.  No past medical history on file.  Current Outpatient Medications:    HYDROcodone-acetaminophen (NORCO/VICODIN) 5-325 MG tablet, Take 1 tablet by mouth every 6 (six) hours as needed for severe pain., Disp: 10 tablet, Rfl: 0   meloxicam (MOBIC) 7.5 MG tablet, Take 1 tablet (7.5 mg total) by mouth daily., Disp: 15 tablet, Rfl: 0  Social History   Tobacco Use  Smoking Status Every Day   Packs/day: 0.25   Years: 10.00   Pack years: 2.50   Types: Cigarettes  Smokeless Tobacco Not on file    No Known Allergies Objective:  There were no vitals filed for this visit. There is no height or weight on file to calculate BMI. Constitutional Well developed. Well nourished.  Vascular Dorsalis pedis pulses palpable bilaterally. Posterior tibial pulses palpable bilaterally. Capillary refill normal to all digits.  No cyanosis or clubbing noted. Pedal hair growth normal.  Neurologic Normal speech. Oriented to person, place, and time. Epicritic sensation to light touch grossly present bilaterally.  Dermatologic Nails well groomed and normal in appearance. No open wounds. No skin lesions.  Orthopedic:  No pain on palpation left dorsal midfoot.  No pain with range of motion of the first  through fifth metatarsophalangeal joint.  No pain with resisted dorsiflexion of the digits or plantarflexion of the digit.  Mild exostosis present at the dorsal midfoot.  No other bony abnormalities identified.  Lisfranc interval well-maintained  Pain on palpation along the course of the posterior tibial tendon including the insertion.  Pain with resisted inversion plantarflexion of the foot.  No pain with dorsiflexion eversion of the foot.  No pain at the peroneal tendon, Achilles tendon, ATFL ligament   Radiographs: 3 views of skeletally mature adult left foot: Midfoot arthritis noted.  No bony abnormalities identified.  No bunion deformity noted.  No fractures noted.   Assessment:   1. Posterior tibial tendinitis, left   2. Foot deformity, bilateral     Plan:  Patient was evaluated and treated and all questions answered.  Left midfoot arthritis -Clinically resolved  Posterior tibial tendinitis left -I explained the patient the etiology of posterior greater tibial tendinitis and various treatment options were discussed.  The driving force is likely pes planovalgus foot structure.  Given that he still has a lot of residual pain without any resolve meant I believe patient will benefit from an MRI evaluation however he states that he will work with his Worker's Comp. tomorrow and get an MRI. -Continue using Tri-Lock ankle brace as needed. -He is awaiting getting MRI  Pes planovalgus -I explained the patient the etiology of pes planovalgus and various treatment options were discussed.  Given that patient is having arch and posterior tibial tendinitis I believe this is likely the symptoms of flatfoot.  At this time patient will benefit from custom-made orthotics. -Orthotics were casted for   No follow-ups on file.

## 2021-07-08 ENCOUNTER — Encounter: Payer: Self-pay | Admitting: Podiatry

## 2021-07-15 ENCOUNTER — Ambulatory Visit: Payer: Managed Care, Other (non HMO) | Admitting: Podiatry

## 2021-07-21 ENCOUNTER — Encounter: Payer: Self-pay | Admitting: Podiatry

## 2021-07-25 ENCOUNTER — Encounter: Payer: Self-pay | Admitting: Podiatry

## 2021-07-26 ENCOUNTER — Encounter: Payer: Self-pay | Admitting: Podiatry

## 2021-08-10 ENCOUNTER — Ambulatory Visit (INDEPENDENT_AMBULATORY_CARE_PROVIDER_SITE_OTHER): Payer: Managed Care, Other (non HMO) | Admitting: Podiatry

## 2021-08-10 ENCOUNTER — Other Ambulatory Visit: Payer: Self-pay

## 2021-08-10 ENCOUNTER — Encounter: Payer: Self-pay | Admitting: Podiatry

## 2021-08-10 DIAGNOSIS — Q666 Other congenital valgus deformities of feet: Secondary | ICD-10-CM | POA: Diagnosis not present

## 2021-08-10 DIAGNOSIS — M76822 Posterior tibial tendinitis, left leg: Secondary | ICD-10-CM | POA: Diagnosis not present

## 2021-08-10 NOTE — Progress Notes (Signed)
Subjective:  Patient ID: Dennis Greene, male    DOB: Feb 27, 1981,  MRN: [id number]  Chief Complaint  Patient presents with   Foot Pain    Left foot pain     40 y.o. male presents with the above complaint.  Patient presents with follow-up of left medial posterior tibial tendinitis.  Patient states is little bit better however he is still working on getting prior authorization approved through [employer].  He states the brace helps a little bit.  He has not been had any success with MRI.  He denies any other acute complaints.  He is working on getting MRI approved through the [employer].   Review of Systems: Negative except as noted in the HPI. Denies N/V/F/Ch.  No past medical history on file.  Current Outpatient Medications:    HYDROcodone-acetaminophen (NORCO/VICODIN) 5-325 MG tablet, Take 1 tablet by mouth every 6 (six) hours as needed for severe pain., Disp: 10 tablet, Rfl: 0   meloxicam (MOBIC) 7.5 MG tablet, Take 1 tablet (7.5 mg total) by mouth daily., Disp: 15 tablet, Rfl: 0  Social History   Tobacco Use  Smoking Status Every Day   Packs/day: 0.25   Years: 10.00   Pack years: 2.50   Types: Cigarettes  Smokeless Tobacco Not on file    No Known Allergies Objective:  There were no vitals filed for this visit. There is no height or weight on file to calculate BMI. Constitutional Well developed. Well nourished.  Vascular Dorsalis pedis pulses palpable bilaterally. Posterior tibial pulses palpable bilaterally. Capillary refill normal to all digits.  No cyanosis or clubbing noted. Pedal hair growth normal.  Neurologic Normal speech. Oriented to person, place, and time. Epicritic sensation to light touch grossly present bilaterally.  Dermatologic Nails well groomed and normal in appearance. No open wounds. No skin lesions.  Orthopedic:  No pain on palpation left dorsal midfoot.  No pain with range of motion of the first through fifth metatarsophalangeal joint.  No  pain with resisted dorsiflexion of the digits or plantarflexion of the digit.  Mild exostosis present at the dorsal midfoot.  No other bony abnormalities identified.  Lisfranc interval well-maintained  Pain on palpation along the course of the posterior tibial tendon including the insertion.  Pain with resisted inversion plantarflexion of the foot.  No pain with dorsiflexion eversion of the foot.  No pain at the peroneal tendon, Achilles tendon, ATFL ligament   Radiographs: 3 views of skeletally mature adult left foot: Midfoot arthritis noted.  No bony abnormalities identified.  No bunion deformity noted.  No fractures noted.   Assessment:   1. Posterior tibial tendinitis, left   2. Pes planovalgus      Plan:  Patient was evaluated and treated and all questions answered.  Left midfoot arthritis -Clinically resolved  Posterior tibial tendinitis left -I explained the patient the etiology of posterior greater tibial tendinitis and various treatment options were discussed.  The driving force is likely pes planovalgus foot structure.  Given that he still has a lot of residual pain without any resolve meant I believe patient will benefit from an MRI evaluation however he states that he will work with his Worker's Comp. tomorrow and get an MRI. -Continue using Tri-Lock ankle brace as needed. -He is awaiting getting MRI  Pes planovalgus -I explained the patient the etiology of pes planovalgus and various treatment options were discussed.  Given that patient is having arch and posterior tibial tendinitis I believe this is likely the symptoms  of flatfoot.  At this time patient will benefit from custom-made orthotics. -I will wait for him to see if the orthotics will be approved to be casted.  Patient states understanding   No follow-ups on file.

## 2021-08-15 ENCOUNTER — Encounter: Payer: Self-pay | Admitting: Podiatry

## 2021-08-22 ENCOUNTER — Ambulatory Visit (INDEPENDENT_AMBULATORY_CARE_PROVIDER_SITE_OTHER): Payer: Managed Care, Other (non HMO) | Admitting: Podiatry

## 2021-08-22 ENCOUNTER — Encounter: Payer: Self-pay | Admitting: Podiatry

## 2021-08-22 ENCOUNTER — Other Ambulatory Visit: Payer: Self-pay

## 2021-08-22 DIAGNOSIS — Q666 Other congenital valgus deformities of feet: Secondary | ICD-10-CM

## 2021-08-22 DIAGNOSIS — M76822 Posterior tibial tendinitis, left leg: Secondary | ICD-10-CM

## 2021-08-22 NOTE — Progress Notes (Signed)
Patient presents to be casted for orthotics  A foam impression was casted his right and left foot  Patient is a size 10  Patient will be contacted when the orthotics are ready for pick up

## 2021-08-31 ENCOUNTER — Encounter: Payer: Self-pay | Admitting: Podiatry

## 2021-09-14 ENCOUNTER — Ambulatory Visit: Payer: Managed Care, Other (non HMO) | Admitting: Podiatry

## 2021-09-19 ENCOUNTER — Encounter: Payer: Self-pay | Admitting: Podiatry

## 2021-09-26 ENCOUNTER — Encounter: Payer: Self-pay | Admitting: Podiatry

## 2021-09-28 ENCOUNTER — Other Ambulatory Visit: Payer: Self-pay

## 2021-09-28 ENCOUNTER — Ambulatory Visit (INDEPENDENT_AMBULATORY_CARE_PROVIDER_SITE_OTHER): Payer: Managed Care, Other (non HMO) | Admitting: Podiatry

## 2021-09-28 ENCOUNTER — Encounter: Payer: Self-pay | Admitting: Podiatry

## 2021-09-28 DIAGNOSIS — M76822 Posterior tibial tendinitis, left leg: Secondary | ICD-10-CM | POA: Diagnosis not present

## 2021-09-28 DIAGNOSIS — Q666 Other congenital valgus deformities of feet: Secondary | ICD-10-CM

## 2021-09-29 NOTE — Progress Notes (Signed)
  Subjective:  Patient ID: Dennis Greene, male    DOB: 04-Jun-1981,  MRN: [id number]  Chief Complaint  Patient presents with   Foot Pain    Left foot     40 y.o. male presents with the above complaint.  Patient presents with follow-up of left medial posterior tibial tendinitis.  He states is doing a lot better.  He would like to hold off on the MRI through [employer].  He would like to get his orthotics.  He denies any other acute complaints..   Review of Systems: Negative except as noted in the HPI. Denies N/V/F/Ch.  No past medical history on file.  Current Outpatient Medications:    HYDROcodone-acetaminophen (NORCO/VICODIN) 5-325 MG tablet, Take 1 tablet by mouth every 6 (six) hours as needed for severe pain., Disp: 10 tablet, Rfl: 0   meloxicam (MOBIC) 7.5 MG tablet, Take 1 tablet (7.5 mg total) by mouth daily., Disp: 15 tablet, Rfl: 0  Social History   Tobacco Use  Smoking Status Every Day   Packs/day: 0.25   Years: 10.00   Pack years: 2.50   Types: Cigarettes  Smokeless Tobacco Not on file    No Known Allergies Objective:  There were no vitals filed for this visit. There is no height or weight on file to calculate BMI. Constitutional Well developed. Well nourished.  Vascular Dorsalis pedis pulses palpable bilaterally. Posterior tibial pulses palpable bilaterally. Capillary refill normal to all digits.  No cyanosis or clubbing noted. Pedal hair growth normal.  Neurologic Normal speech. Oriented to person, place, and time. Epicritic sensation to light touch grossly present bilaterally.  Dermatologic Nails well groomed and normal in appearance. No open wounds. No skin lesions.  Orthopedic:  No pain on palpation left dorsal midfoot.  No pain with range of motion of the first through fifth metatarsophalangeal joint.  No pain with resisted dorsiflexion of the digits or plantarflexion of the digit.  Mild exostosis present at the dorsal midfoot.  No other bony abnormalities  identified.  Lisfranc interval well-maintained  Pain on palpation along the course of the posterior tibial tendon including the insertion.  Pain with resisted inversion plantarflexion of the foot.  No pain with dorsiflexion eversion of the foot.  No pain at the peroneal tendon, Achilles tendon, ATFL ligament   Radiographs: 3 views of skeletally mature adult left foot: Midfoot arthritis noted.  No bony abnormalities identified.  No bunion deformity noted.  No fractures noted.   Assessment:   1. Posterior tibial tendinitis, left   2. Pes planovalgus       Plan:  Patient was evaluated and treated and all questions answered.  Left midfoot arthritis -Clinically resolved  Posterior tibial tendinitis left -Clinically the pain resolved.  We will hold off on getting an MRI through [employer].  At this time I discussed shoe gear modification as well as orthotics management.  I discussed with him that is important wear the orthotics at all times.  He states understanding will do so.  Pes planovalgus -I explained the patient the etiology of pes planovalgus and various treatment options were discussed.  Given that patient is having arch and posterior tibial tendinitis I believe this is likely the symptoms of flatfoot.  At this time patient will benefit from custom-made orthotics. -Orthotics were dispensed they are functioning well with good correction of the foot alignment   No follow-ups on file.

## 2021-10-12 ENCOUNTER — Encounter: Payer: Self-pay | Admitting: Podiatry

## 2021-10-17 ENCOUNTER — Encounter: Payer: Self-pay | Admitting: Podiatry

## 2021-11-02 ENCOUNTER — Other Ambulatory Visit: Payer: Self-pay

## 2021-11-02 ENCOUNTER — Encounter: Payer: Self-pay | Admitting: Podiatry

## 2021-11-02 ENCOUNTER — Ambulatory Visit (INDEPENDENT_AMBULATORY_CARE_PROVIDER_SITE_OTHER): Payer: Managed Care, Other (non HMO) | Admitting: Podiatry

## 2021-11-02 DIAGNOSIS — M76822 Posterior tibial tendinitis, left leg: Secondary | ICD-10-CM

## 2021-11-02 DIAGNOSIS — Q666 Other congenital valgus deformities of feet: Secondary | ICD-10-CM

## 2021-11-02 NOTE — Progress Notes (Signed)
Subjective:  Patient ID: Dennis Greene, male    DOB: October 04, 1981,  MRN: [id number]  Chief Complaint  Patient presents with   Foot Pain    Left foot pain Pt stated that the pain has got better     40 y.o. male presents with the above complaint.  Patient presents with follow-up of left medial posterior tibial tendinitis.  He states is doing better however he is hurting when he has to do a lot with his foot.  He would like to know if he can get shoes approved for short-term disability to limit the amount of work.  He denies any other acute complaints.   Review of Systems: Negative except as noted in the HPI. Denies N/V/F/Ch.  No past medical history on file.  Current Outpatient Medications:    HYDROcodone-acetaminophen (NORCO/VICODIN) 5-325 MG tablet, Take 1 tablet by mouth every 6 (six) hours as needed for severe pain., Disp: 10 tablet, Rfl: 0   meloxicam (MOBIC) 7.5 MG tablet, Take 1 tablet (7.5 mg total) by mouth daily., Disp: 15 tablet, Rfl: 0  Social History   Tobacco Use  Smoking Status Every Day   Packs/day: 0.25   Years: 10.00   Pack years: 2.50   Types: Cigarettes  Smokeless Tobacco Not on file    No Known Allergies Objective:  There were no vitals filed for this visit. There is no height or weight on file to calculate BMI. Constitutional Well developed. Well nourished.  Vascular Dorsalis pedis pulses palpable bilaterally. Posterior tibial pulses palpable bilaterally. Capillary refill normal to all digits.  No cyanosis or clubbing noted. Pedal hair growth normal.  Neurologic Normal speech. Oriented to person, place, and time. Epicritic sensation to light touch grossly present bilaterally.  Dermatologic Nails well groomed and normal in appearance. No open wounds. No skin lesions.  Orthopedic: Mild pain on palpation left dorsal midfoot.  No pain with range of motion of the first through fifth metatarsophalangeal joint.  No pain with resisted dorsiflexion of the digits  or plantarflexion of the digit.  Mild exostosis present at the dorsal midfoot.  No other bony abnormalities identified.  Lisfranc interval well-maintained  Pain on palpation along the course of the posterior tibial tendon including the insertion.  Pain with resisted inversion plantarflexion of the foot.  No pain with dorsiflexion eversion of the foot.  No pain at the peroneal tendon, Achilles tendon, ATFL ligament   Radiographs: 3 views of skeletally mature adult left foot: Midfoot arthritis noted.  No bony abnormalities identified.  No bunion deformity noted.  No fractures noted.   Assessment:   1. Posterior tibial tendinitis, left   2. Pes planovalgus        Plan:  Patient was evaluated and treated and all questions answered.  Left midfoot arthritis -Clinically resolved  Posterior tibial tendinitis left -Clinically patient is unable to perform/bending his foot good more than 3 hours.  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  He states it starts hurting if he does anything with his foot after that.  I am happy to help him limit the amount of work to less than 3 hours.  He states understanding.  He will try to do short-term disability to help with that.  Pes planovalgus -I explained the patient the etiology of pes planovalgus and various treatment options were discussed.  Given that patient is having arch and posterior tibial tendinitis I believe this is likely the symptoms of flatfoot.  At this time patient will benefit from custom-made orthotics. -  Orthotics were dispensed they are functioning well with good correction of the foot alignment   No follow-ups on file.

## 2021-11-29 ENCOUNTER — Encounter: Payer: Self-pay | Admitting: Podiatry

## 2021-12-02 ENCOUNTER — Ambulatory Visit: Payer: Managed Care, Other (non HMO) | Admitting: Podiatry

## 2021-12-21 ENCOUNTER — Other Ambulatory Visit: Payer: Self-pay

## 2021-12-21 ENCOUNTER — Encounter: Payer: Self-pay | Admitting: Podiatry

## 2021-12-21 ENCOUNTER — Ambulatory Visit (INDEPENDENT_AMBULATORY_CARE_PROVIDER_SITE_OTHER): Payer: Managed Care, Other (non HMO) | Admitting: Podiatry

## 2021-12-21 DIAGNOSIS — M76822 Posterior tibial tendinitis, left leg: Secondary | ICD-10-CM | POA: Diagnosis not present

## 2021-12-21 MED ORDER — MELOXICAM 7.5 MG PO TABS: 7.5000 mg | ORAL_TABLET | Freq: Every day | ORAL | 0 refills | Status: AC

## 2021-12-21 NOTE — Progress Notes (Signed)
me

## 2021-12-21 NOTE — Progress Notes (Signed)
Subjective:  Patient ID: Dennis Greene, male    DOB: [birth year],  MRN: [id number]  Chief Complaint  Patient presents with   Foot Pain    Left foot pain  Pt stated that the pain comes and goes he would like a refill on his medication     41 y.o. male presents with the above complaint.  Patient presents with follow-up of left medial posterior tibial tendinitis.  He states is doing better.  He occasionally has pain once in a while.  The orthotics helped considerably.  The meloxicam helps.  He would like a refill on it.  He denies any other acute complaints.   Review of Systems: Negative except as noted in the HPI. Denies N/V/F/Ch.  No past medical history on file.  Current Outpatient Medications:    HYDROcodone-acetaminophen (NORCO/VICODIN) 5-325 MG tablet, Take 1 tablet by mouth every 6 (six) hours as needed for severe pain., Disp: 10 tablet, Rfl: 0   meloxicam (MOBIC) 7.5 MG tablet, Take 1 tablet (7.5 mg total) by mouth daily., Disp: 15 tablet, Rfl: 0  Social History   Tobacco Use  Smoking Status Every Day   Packs/day: 0.25   Years: 10.00   Pack years: 2.50   Types: Cigarettes  Smokeless Tobacco Not on file    No Known Allergies Objective:  There were no vitals filed for this visit. There is no height or weight on file to calculate BMI. Constitutional Well developed. Well nourished.  Vascular Dorsalis pedis pulses palpable bilaterally. Posterior tibial pulses palpable bilaterally. Capillary refill normal to all digits.  No cyanosis or clubbing noted. Pedal hair growth normal.  Neurologic Normal speech. Oriented to person, place, and time. Epicritic sensation to light touch grossly present bilaterally.  Dermatologic Nails well groomed and normal in appearance. No open wounds. No skin lesions.  Orthopedic: Mild pain on palpation left dorsal midfoot.  No pain with range of motion of the first through fifth metatarsophalangeal joint.  No pain with resisted dorsiflexion of the  digits or plantarflexion of the digit.  Mild exostosis present at the dorsal midfoot.  No other bony abnormalities identified.  Lisfranc interval well-maintained  Very mild pain on palpation along the course of the posterior tibial tendon including the insertion.  Mild pain with resisted inversion plantarflexion of the foot.  No pain with dorsiflexion eversion of the foot.  No pain at the peroneal tendon, Achilles tendon, ATFL ligament   Radiographs: 3 views of skeletally mature adult left foot: Midfoot arthritis noted.  No bony abnormalities identified.  No bunion deformity noted.  No fractures noted.   Assessment:   No diagnosis found.      Plan:  Patient was evaluated and treated and all questions answered.  Left midfoot arthritis -Clinically resolved  Posterior tibial tendinitis left -Clinically he is able to resume his regular activities.  I discussed shoe gear modification as well as continued use of orthotics.  If he is unable to resume regular activities he will reach back out to me.  For now if any foot and ankle issues arise future I will asked him to come see me.  Pes planovalgus -I explained the patient the etiology of pes planovalgus and various treatment options were discussed.  Given that patient is having arch and posterior tibial tendinitis I believe this is likely the symptoms of flatfoot.  At this time patient will benefit from custom-made orthotics. -Orthotics were dispensed they are functioning well with good correction of the foot alignment   No  follow-ups on file.

## 2022-01-20 ENCOUNTER — Ambulatory Visit: Payer: Managed Care, Other (non HMO) | Admitting: Podiatry

## 2022-03-31 ENCOUNTER — Encounter: Payer: Self-pay | Admitting: Podiatry

## 2022-05-03 ENCOUNTER — Ambulatory Visit: Payer: Managed Care, Other (non HMO) | Admitting: Podiatry

## 2022-06-05 ENCOUNTER — Encounter: Payer: Self-pay | Admitting: Podiatry

## 2022-06-28 ENCOUNTER — Encounter: Payer: Self-pay | Admitting: Podiatry

## 2022-07-07 ENCOUNTER — Encounter: Payer: Self-pay | Admitting: Podiatry

## 2022-07-07 ENCOUNTER — Ambulatory Visit: Payer: Managed Care, Other (non HMO) | Admitting: Podiatry

## 2022-07-21 ENCOUNTER — Ambulatory Visit (INDEPENDENT_AMBULATORY_CARE_PROVIDER_SITE_OTHER): Payer: Managed Care, Other (non HMO) | Admitting: Podiatry

## 2022-07-21 ENCOUNTER — Encounter: Payer: Self-pay | Admitting: Podiatry

## 2022-07-21 DIAGNOSIS — R252 Cramp and spasm: Secondary | ICD-10-CM

## 2022-07-21 NOTE — Progress Notes (Signed)
  Subjective:  Patient ID: Dennis Greene, male    DOB: 1981/01/10,  MRN: [id number]  Chief Complaint  Patient presents with   Foot Pain    Pt stated that the inserts do help with the pain but he does have some cramping     41 y.o. male presents with the above complaint.  Patient presents with complaint of muscle cramps to the left lower extremity.  Patient states that he just wanted get it evaluated.  He happens while he is sitting down and driving.  The orthotics have been helping considerably.  He does not have much of left posterior tibial tendinitis pain.  He wants to make sure that there is nothing concerning with the muscle cramps.  He denies any other acute complaints.  He would like to discuss treatment options for it.  He does not take muscle relaxer   Review of Systems: Negative except as noted in the HPI. Denies N/V/F/Ch.  No past medical history on file.  Current Outpatient Medications:    HYDROcodone-acetaminophen (NORCO/VICODIN) 5-325 MG tablet, Take 1 tablet by mouth every 6 (six) hours as needed for severe pain., Disp: 10 tablet, Rfl: 0   meloxicam (MOBIC) 7.5 MG tablet, Take 1 tablet (7.5 mg total) by mouth daily., Disp: 15 tablet, Rfl: 0  Social History   Tobacco Use  Smoking Status Every Day   Packs/day: 0.25   Years: 10.00   Total pack years: 2.50   Types: Cigarettes  Smokeless Tobacco Not on file    No Known Allergies Objective:  There were no vitals filed for this visit. There is no height or weight on file to calculate BMI. Constitutional Well developed. Well nourished.  Vascular Dorsalis pedis pulses palpable bilaterally. Posterior tibial pulses palpable bilaterally. Capillary refill normal to all digits.  No cyanosis or clubbing noted. Pedal hair growth normal.  Neurologic Normal speech. Oriented to person, place, and time. Epicritic sensation to light touch grossly present bilaterally.  Dermatologic Nails well groomed and normal in appearance. No  open wounds. No skin lesions.  Orthopedic: Unable to recreate the cramping today in clinic.  Generally is plantarflexed inverted foot structure.  This is likely due to patient pressing on the gas pedal while driving.   Radiographs: None Assessment:   1. Muscle cramps    Plan:  Patient was evaluated and treated and all questions answered.  Muscle cramp likely due to positional -All questions and concerns were discussed with the patient in extensive detail -At this time patient has palpable pulses therefore it is not vascular related muscle cramps.  I discussed with him this might be more positional especially while it primarily happens in the car.  I discussed with him to keep it stretched out. -If it continues to happen we will discuss Flexeril.  He agrees with the plan.  No follow-ups on file.

## 2022-08-15 ENCOUNTER — Encounter: Payer: Self-pay | Admitting: Podiatry

## 2022-08-18 ENCOUNTER — Ambulatory Visit: Payer: Managed Care, Other (non HMO) | Admitting: Podiatry

## 2022-08-18 ENCOUNTER — Encounter: Payer: Self-pay | Admitting: Podiatry

## 2022-08-25 ENCOUNTER — Ambulatory Visit: Payer: Managed Care, Other (non HMO) | Admitting: Podiatry

## 2022-08-25 ENCOUNTER — Encounter: Payer: Self-pay | Admitting: Podiatry

## 2022-09-15 ENCOUNTER — Ambulatory Visit (INDEPENDENT_AMBULATORY_CARE_PROVIDER_SITE_OTHER): Payer: Managed Care, Other (non HMO) | Admitting: Podiatry

## 2022-09-15 ENCOUNTER — Encounter: Payer: Self-pay | Admitting: Podiatry

## 2022-09-15 DIAGNOSIS — L603 Nail dystrophy: Secondary | ICD-10-CM

## 2022-09-15 NOTE — Progress Notes (Signed)
  Subjective:  Patient ID: Dennis Greene, male    DOB: 02/04/1981,  MRN: [id number]  Chief Complaint  Patient presents with   Toe Pain    41 y.o. male presents with the above complaint.  Left second digit nail dystrophy.  Patient has a history of trauma to the nail.  It is making it grow inwards and causing him some pain.  He has not seen anyone else prior to seeing me for this particular thing.  He would like to discuss treatment options for it.  He does not want to take the nail off.   Review of Systems: Negative except as noted in the HPI. Denies N/V/F/Ch.  History reviewed. No pertinent past medical history.  Current Outpatient Medications:    diclofenac Sodium (VOLTAREN) 1 % GEL, APPLY 2 GRAMS TO THE AFFECTED AREA(S) BY TOPICAL ROUTE 4 TIMES PER DAY, Disp: , Rfl:    HYDROcodone-acetaminophen (NORCO/VICODIN) 5-325 MG tablet, Take 1 tablet by mouth every 6 (six) hours as needed for severe pain., Disp: 10 tablet, Rfl: 0   meloxicam (MOBIC) 7.5 MG tablet, Take 1 tablet (7.5 mg total) by mouth daily., Disp: 15 tablet, Rfl: 0  Social History   Tobacco Use  Smoking Status Every Day   Packs/day: 0.25   Years: 10.00   Total pack years: 2.50   Types: Cigarettes  Smokeless Tobacco Not on file    No Known Allergies Objective:  There were no vitals filed for this visit. There is no height or weight on file to calculate BMI. Constitutional Well developed. Well nourished.  Vascular Dorsalis pedis pulses palpable bilaterally. Posterior tibial pulses palpable bilaterally. Capillary refill normal to all digits.  No cyanosis or clubbing noted. Pedal hair growth normal.  Neurologic Normal speech. Oriented to person, place, and time. Epicritic sensation to light touch grossly present bilaterally.  Dermatologic Nails left second digit nail dystrophy with ingrowing of the nail at the distal tip curvature of the nail likely due to nail trauma.  No clinical signs of infection noted Skin none   Orthopedic: Normal joint ROM without pain or crepitus bilaterally. No visible deformities. No bony tenderness.   Radiographs: None Assessment:   1. Nail dystrophy    Plan:  Patient was evaluated and treated and all questions answered.  Left second digit nail dystrophy -All questions and concerns were discussed with the patient in extensive detail given the amount of pain that he is having he will benefit from total nail avulsion however he would like to hold off and think about it before we remove the nail.  He was to come back and see me.  For now the nail was debrided down to healthy striated tissue.  No complication no pinpoint bleeding noted  No follow-ups on file.

## 2022-09-28 ENCOUNTER — Encounter: Payer: Self-pay | Admitting: Podiatry

## 2022-10-05 ENCOUNTER — Ambulatory Visit: Payer: Managed Care, Other (non HMO) | Admitting: Podiatry

## 2022-10-27 ENCOUNTER — Ambulatory Visit: Payer: Managed Care, Other (non HMO) | Admitting: Podiatry

## 2022-11-16 ENCOUNTER — Encounter: Payer: Self-pay | Admitting: Podiatry

## 2022-11-21 ENCOUNTER — Ambulatory Visit: Payer: Managed Care, Other (non HMO) | Admitting: Podiatry

## 2022-12-05 ENCOUNTER — Ambulatory Visit (INDEPENDENT_AMBULATORY_CARE_PROVIDER_SITE_OTHER): Payer: Managed Care, Other (non HMO) | Admitting: Podiatry

## 2022-12-05 VITALS — BP 128/68

## 2022-12-05 DIAGNOSIS — L603 Nail dystrophy: Secondary | ICD-10-CM | POA: Diagnosis not present

## 2022-12-05 NOTE — Progress Notes (Signed)
  Subjective:  Patient ID: Dennis Greene, male    DOB: Jun 02, 1981,  MRN: [id number]  Chief Complaint  Patient presents with   Nail Problem    Pt would like his nail removed     42 y.o. male presents with the above complaint.  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  Patient would like to have it removed.  This is causing him pain he wants to see how it grows back.  He does not want to make it permanent.  Denies any other acute complaints.   Review of Systems: Negative except as noted in the HPI. Denies N/V/F/Ch.  No past medical history on file.  Current Outpatient Medications:    diclofenac Sodium (VOLTAREN) 1 % GEL, APPLY 2 GRAMS TO THE AFFECTED AREA(S) BY TOPICAL ROUTE 4 TIMES PER DAY, Disp: , Rfl:    HYDROcodone-acetaminophen (NORCO/VICODIN) 5-325 MG tablet, Take 1 tablet by mouth every 6 (six) hours as needed for severe pain., Disp: 10 tablet, Rfl: 0   meloxicam (MOBIC) 7.5 MG tablet, Take 1 tablet (7.5 mg total) by mouth daily., Disp: 15 tablet, Rfl: 0  Social History   Tobacco Use  Smoking Status Every Day   Packs/day: 0.25   Years: 10.00   Total pack years: 2.50   Types: Cigarettes  Smokeless Tobacco Not on file    No Known Allergies Objective:   Vitals:   12/05/22 0907  BP: 128/68   There is no height or weight on file to calculate BMI. Constitutional Well developed. Well nourished.  Vascular Dorsalis pedis pulses palpable bilaterally. Posterior tibial pulses palpable bilaterally. Capillary refill normal to all digits.  No cyanosis or clubbing noted. Pedal hair growth normal.  Neurologic Normal speech. Oriented to person, place, and time. Epicritic sensation to light touch grossly present bilaterally.  Dermatologic Pain on palpation of the entire/total nail on 2nd digit of the left No other open wounds. No skin lesions.  Orthopedic: Normal joint ROM without pain or crepitus bilaterally. No visible  deformities. No bony tenderness.   Radiographs: None Assessment:  No diagnosis found. Plan:  Patient was evaluated and treated and all questions answered.  Nail contusion/dystrophy second digit, left -Patient elects to proceed with minor surgery to remove entire toenail today. Consent reviewed and signed by patient. -Entire/total nail excised. See procedure note. -Educated on post-procedure care including soaking. Written instructions provided and reviewed. -Patient to follow up in 2 weeks for nail check.  Procedure: Excision of entire/total nail  Location: Left 2nd toe digit Anesthesia: Lidocaine 1% plain; 1.5 mL and Marcaine 0.5% plain; 1.5 mL, digital block. Skin Prep: Betadine. Dressing: Silvadene; telfa; dry, sterile, compression dressing. Technique: Following skin prep, the toe was exsanguinated and a tourniquet was secured at the base of the toe. The affected nail border was freed and excised. The tourniquet was then removed and sterile dressing applied. Disposition: Patient tolerated procedure well. Patient to return in 2 weeks for follow-up.   No follow-ups on file.

## 2022-12-12 ENCOUNTER — Encounter: Payer: Self-pay | Admitting: Podiatry
